# Patient Record
Sex: Male | Born: 1985 | Race: White | Hispanic: No | Smoking: Never smoker
Health system: Southern US, Community
[De-identification: ages and names within clinical notes are randomized; demographics above are authoritative.]

## PROBLEM LIST (undated history)

## (undated) DIAGNOSIS — Q431 Hirschsprung's disease: Secondary | ICD-10-CM

## (undated) HISTORY — PX: OTHER SURGICAL HISTORY: SHX169

---

## 2007-06-19 ENCOUNTER — Emergency Department (HOSPITAL_COMMUNITY): Admission: EM | Admit: 2007-06-19 | Discharge: 2007-06-19 | Payer: Self-pay | Admitting: Emergency Medicine

## 2018-06-10 ENCOUNTER — Ambulatory Visit: Payer: Self-pay | Admitting: *Deleted

## 2018-06-10 NOTE — Telephone Encounter (Signed)
Patient is calling to get an appointment at [facility]- he has not established care at the office.Due to the moderate/severe pain he is having the protocol is to go to ED for evaluation. Patient advised to go to ED at this time- he agrees. Encouraged him to call back to establish care with provider so if he ever needs to be seen for a less acute problem we can accommodate him.  Reason for Disposition . [1] SEVERE pain (e.g., excruciating) AND [2] present > 1 hour  Answer Assessment - Initial Assessment Questions 1. LOCATION: "Where does it hurt?"      Midline lower abdominal pain and moves upward then to the right 2. RADIATION: "Does the pain shoot anywhere else?" (e.g., chest, back)      Moves to the right 3. ONSET: "When did the pain begin?" (Minutes, hours or days ago)      tuesday 4. SUDDEN: "Gradual or sudden onset?"     Sudden- after eating tuna 5. PATTERN "Does the pain come and go, or is it constant?"    - If constant: "Is it getting better, staying the same, or worsening?"      (Note: Constant means the pain never goes away completely; most serious pain is constant and it progresses)     - If intermittent: "How long does it last?" "Do you have pain now?"     (Note: Intermittent means the pain goes away completely between bouts)     Comes and goes- "waves",  Pain is more dull 6. SEVERITY: "How bad is the pain?"  (e.g., Scale 1-10; mild, moderate, or severe)    - MILD (1-3): doesn't interfere with normal activities, abdomen soft and not tender to touch     - MODERATE (4-7): interferes with normal activities or awakens from sleep, tender to touch     - SEVERE (8-10): excruciating pain, doubled over, unable to do any normal activities       7-8 when it is really hurting- 2-3 at dull pain 7. RECURRENT SYMPTOM: "Have you ever had this type of abdominal pain before?" If so, ask: "When was the last time?" and "What happened that time?"      no 8. CAUSE: "What do you think is causing  the abdominal pain?"     At first patient thought it was food related- but now not sure 9. RELIEVING/AGGRAVATING FACTORS: "What makes it better or worse?" (e.g., movement, antacids, bowel movement)     Eating- makes it worse,  Laxatives- still having bowel movements 10. OTHER SYMPTOMS: "Has there been any vomiting, diarrhea, constipation, or urine problems?"       Last night patient had a lot of burping  Protocols used: ABDOMINAL PAIN - MALE-A-AH

## 2018-11-12 ENCOUNTER — Telehealth (INDEPENDENT_AMBULATORY_CARE_PROVIDER_SITE_OTHER): Payer: Self-pay | Admitting: Physical Medicine and Rehabilitation

## 2018-11-12 NOTE — Telephone Encounter (Signed)
Needs to be OV after that long

## 2018-11-12 NOTE — Telephone Encounter (Signed)
Pt was last seen in 2012 and had a Rt S1 TF. Please see message below. Ok to schedule for OV?

## 2018-11-12 NOTE — Telephone Encounter (Addendum)
Pt came in stating he had an injection a few years ago regarding a bulging disk in his lower back. He came in and is experiencing pain in the same place where he had the injection before. Pt was wondering if he can make an apt with Dr. Alvester Morin to see if he could help he does still have his MRI disk from when he had it done the first time regarding this issue. PT # [phone]

## 2018-11-12 NOTE — Telephone Encounter (Signed)
Pt scheduled for 11/24/2018

## 2018-11-24 ENCOUNTER — Ambulatory Visit (INDEPENDENT_AMBULATORY_CARE_PROVIDER_SITE_OTHER): Payer: 59 | Admitting: Physical Medicine and Rehabilitation

## 2018-11-24 ENCOUNTER — Telehealth (INDEPENDENT_AMBULATORY_CARE_PROVIDER_SITE_OTHER): Payer: Self-pay | Admitting: Physical Medicine and Rehabilitation

## 2018-11-24 ENCOUNTER — Encounter (INDEPENDENT_AMBULATORY_CARE_PROVIDER_SITE_OTHER): Payer: Self-pay | Admitting: Physical Medicine and Rehabilitation

## 2018-11-24 VITALS — BP 123/77 | HR 76 | Ht 71.0 in | Wt 205.0 lb

## 2018-11-24 DIAGNOSIS — M25551 Pain in right hip: Secondary | ICD-10-CM | POA: Diagnosis not present

## 2018-11-24 DIAGNOSIS — M5116 Intervertebral disc disorders with radiculopathy, lumbar region: Secondary | ICD-10-CM | POA: Diagnosis not present

## 2018-11-24 DIAGNOSIS — M5441 Lumbago with sciatica, right side: Secondary | ICD-10-CM

## 2018-11-24 DIAGNOSIS — G8929 Other chronic pain: Secondary | ICD-10-CM | POA: Diagnosis not present

## 2018-11-24 NOTE — Patient Instructions (Signed)

## 2018-11-24 NOTE — Progress Notes (Signed)
 .  Numeric Pain Rating Scale and Functional Assessment Average Pain 7 Pain Right Now 3 My pain is constant, dull and aching Pain is worse with: bending and some activites Pain improves with: heat/ice and therapy/exercise   In the last MONTH (on 0-10 scale) has pain interfered with the following?  1. General activity like being  able to carry out your everyday physical activities such as walking, climbing stairs, carrying groceries, or moving a chair?  Rating(4)  2. Relation with others like being able to carry out your usual social activities and roles such as  activities at home, at work and in your community. Rating(4)  3. Enjoyment of life such that you have  been bothered by emotional problems such as feeling anxious, depressed or irritable?  Rating(1)

## 2018-11-25 ENCOUNTER — Encounter (INDEPENDENT_AMBULATORY_CARE_PROVIDER_SITE_OTHER): Payer: Self-pay | Admitting: Physical Medicine and Rehabilitation

## 2018-11-25 NOTE — Telephone Encounter (Signed)
Spoke to [employer] and PA was processed and received an Engineer, structural. Auth # [id number] Effective dates 11/25/2018-02/23/2019.

## 2018-11-25 NOTE — Progress Notes (Signed)
Steven Evans - 33 y.o. male MRN [id number]  Date of birth: 1986-10-11  Office Visit Note: Visit Date: 11/24/2018 PCP: System, Pcp Not In Referred by: No ref. provider found  Subjective: Chief Complaint  Patient presents with  . Lower Back - Pain  . Right Knee - Pain  . Right Leg - Weakness   HPI: Steven Evans is a 33 y.o. male who comes in today For evaluation management of worsening low back and right radicular leg pain posteriorly.  I initially saw the patient in 2012 at the request of Dr. Annell Greening in our practice who was seeing him for a Worker's Compensation injury with disc herniation at L5-S1 with pretty classic S1 distribution pain on the right.  S1 transforaminal injection gave him a lot of relief for probably more than a year.  He has had off-and-on pain really since that time.  He would get intermittent flareups and he would see his chiropractor for adjustment which most of the time would alleviate the symptoms enough for him to be able to work and continue to be functional.  Unfortunately 6 weeks ago the symptoms got progressively worse and 3 weeks ago severely worse to the point where he is having trouble even standing on the leg and using the leg very well.  Symptoms are on the right and a constant dull aching pain referring down the posterior part of the leg into the calf but not to the foot.  No real paresthesia but a classic S1 distribution posteriorly.  He is not had any new trauma.  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  He has not noted any left-sided symptoms ever.  He feels like it is in the same distribution as before.  He feels like the leg gets somewhat weak but has not had any falls or foot drop.  He does use anti-inflammatories and those have not helped.  Again has been seeing a [facility] and this had been helping but not recently.  He has not had any red flag complaints however of bowel or bladder issues or specific  night pain or fever chills or night sweats.  Appropriate Use Addendum Patient was evaluated for low back pain and according to appropriate use criteria (did not) receive imaging tests.   Review of Systems  Constitutional: Negative for chills, fever, malaise/fatigue and weight loss.  HENT: Negative for hearing loss and sinus pain.   Eyes: Negative for blurred vision, double vision and photophobia.  Respiratory: Negative for cough and shortness of breath.   Cardiovascular: Negative for chest pain, palpitations and leg swelling.  Gastrointestinal: Negative for abdominal pain, nausea and vomiting.  Genitourinary: Negative for flank pain.  Musculoskeletal: Positive for back pain. Negative for myalgias.       Right hip and leg pain  Skin: Negative for itching and rash.  Neurological: Negative for tremors, focal weakness and weakness.  Endo/Heme/Allergies: Negative.   Psychiatric/Behavioral: Negative for depression.  All other systems reviewed and are negative.  Otherwise per HPI.  Assessment & Plan: Visit Diagnoses:  1. Chronic bilateral low back pain with right-sided sciatica   2. Radiculopathy due to lumbar intervertebral disc disorder   3. Pain in right hip     Plan: Findings:  Chronic history of low back and right buttock and leg pain secondary to disc herniation and degenerative change at L5-S1.  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  He has managed since that time with intermittent flareups using chiropractic care and some anti-inflammatory.  Over the last couple of months progressive worsening symptoms without alleviation from the chiropractic care.  3 weeks of pretty severe pain rates his average pain is a 7 out of 10.  I think this is still consistent with disc herniation type pain in an S1 distribution.  I recommend diagnostic and hopefully therapeutic S1 transforaminal injection with  fluoroscopic guidance.  This should meet with criteria for his insurance company given the severe amount of pain and prior relief as well as length of time that is been ongoing.  He really has tried and failed conservative care.  If the injection did not help or only helped short-term would consider updating MRI at that point.  I also discussed with him core strengthening and stabilization.  He has been doing that with the chiropractor and he initially had physical therapy in 2012.  He has a pretty physical job so does not really do any extra back exercises.  Discussed with him the future of doing some core strengthening and even potentially looking at weight lifting starting out slow and trying to increase the strength of his spine.  Did print out some exercises and those are in the chart.    Meds & Orders: No orders of the defined types were placed in this encounter.  No orders of the defined types were placed in this encounter.   Follow-up: Return for Right S1 transforaminal epidural steroid injection.   Procedures: No procedures performed  No notes on file   Clinical History: No specialty comments available.   He reports that he has never smoked. He has never used smokeless tobacco. No results for input(s): HGBA1C, LABURIC in the last 8760 hours.  Objective:  VS:  HT:5\' 11"  (180.3 cm)   WT:205 lb (93 kg)  BMI:28.6    BP:123/77  HR:76bpm  TEMP: ( )  RESP:  Physical Exam Vitals signs and nursing note reviewed.  Constitutional:      General: He is not in acute distress.    Appearance: Normal appearance. He is well-developed and normal weight.  HENT:     Head: Normocephalic and atraumatic.     Nose: Nose normal.     Mouth/Throat:     Mouth: Mucous membranes are moist.     Pharynx: Oropharynx is clear.  Eyes:     Conjunctiva/sclera: Conjunctivae normal.     Pupils: Pupils are equal, round, and reactive to light.  Neck:     Musculoskeletal: Normal range of motion and neck  supple.     Trachea: No tracheal deviation.  Cardiovascular:     Rate and Rhythm: Normal rate and regular rhythm.     Pulses: Normal pulses.  Pulmonary:     Effort: Pulmonary effort is normal.     Breath sounds: Normal breath sounds.  Abdominal:     General: There is no distension.     Palpations: Abdomen is soft.     Tenderness: There is no guarding or rebound.  Musculoskeletal:        General: No deformity.     Right lower leg: No edema.     Left lower leg: No edema.     Comments: Patient ambulates without aid and can toe and heel walk without difficulty.  He has no pain with hip rotation.  He has some back pain with extension and  facet loading.  He does have more pain with forward flexion.  He has a positive slump test on the right.  Skin:    General: Skin is warm and dry.     Findings: No erythema or rash.  Neurological:     General: No focal deficit present.     Mental Status: He is alert and oriented to person, place, and time.     Sensory: Sensory deficit present.     Motor: No abnormal muscle tone.     Coordination: Coordination normal.     Gait: Gait normal.     Comments: Some dysesthesia on the right and the posterior part of the leg consistent with S1 pattern  Psychiatric:        Mood and Affect: Mood normal.        Behavior: Behavior normal.        Thought Content: Thought content normal.     Ortho Exam Imaging: No results found.  Past Medical/Family/Surgical/Social History: Medications & Allergies reviewed per EMR, new medications updated. There are no active problems to display for this patient.  History reviewed. No pertinent past medical history. History reviewed. No pertinent family history. History reviewed. No pertinent surgical history. Social History   Occupational History  . Not on file  Tobacco Use  . Smoking status: Never Smoker  . Smokeless tobacco: Never Used  Substance and Sexual Activity  . Alcohol use: Not on file  . Drug use: Not on  file  . Sexual activity: Not on file

## 2018-12-03 ENCOUNTER — Ambulatory Visit (INDEPENDENT_AMBULATORY_CARE_PROVIDER_SITE_OTHER): Payer: Self-pay

## 2018-12-03 ENCOUNTER — Ambulatory Visit (INDEPENDENT_AMBULATORY_CARE_PROVIDER_SITE_OTHER): Payer: 59 | Admitting: Physical Medicine and Rehabilitation

## 2018-12-03 ENCOUNTER — Encounter (INDEPENDENT_AMBULATORY_CARE_PROVIDER_SITE_OTHER): Payer: Self-pay | Admitting: Physical Medicine and Rehabilitation

## 2018-12-03 VITALS — BP 126/76 | HR 66 | Temp 98.5°F

## 2018-12-03 DIAGNOSIS — M5116 Intervertebral disc disorders with radiculopathy, lumbar region: Secondary | ICD-10-CM

## 2018-12-03 DIAGNOSIS — M5416 Radiculopathy, lumbar region: Secondary | ICD-10-CM | POA: Diagnosis not present

## 2018-12-03 MED ORDER — BETAMETHASONE SOD PHOS & ACET 6 (3-3) MG/ML IJ SUSP
12.0000 mg | Freq: Once | INTRAMUSCULAR | Status: AC
  Administered 2018-12-03: 12 mg

## 2018-12-03 NOTE — Procedures (Signed)
S1 Lumbosacral Transforaminal Epidural Steroid Injection - Sub-Pedicular Approach with Fluoroscopic Guidance   [name]      Date of Birth: 1986/09/28 MRN: [id number] PCP: System, Pcp Not In      Visit Date: 12/03/2018   Universal Protocol:    Date/Time: 01/31/208:11 AM  Consent Given By: the patient  Position:  PRONE  Additional Comments: Vital signs were monitored before and after the procedure. Patient was prepped and draped in the usual sterile fashion. The correct patient, procedure, and site was verified.   Injection Procedure Details:  Procedure Site One Meds Administered:  Meds ordered this encounter  Medications  . betamethasone acetate-betamethasone sodium phosphate (CELESTONE) injection 12 mg    Laterality: Right  Location/Site:  S1 Foramen   Needle size: 22 ga.  Needle type: Spinal  Needle Placement: Transforaminal  Findings:   -Comments: Excellent flow of contrast along the nerve and into the epidural space.  Procedure Details: After squaring off the sacral end-plate to get a true AP view, the C-arm was positioned so that the best possible view of the S1 foramen was visualized. The soft tissues overlying this structure were infiltrated with 2-3 ml. of 1% Lidocaine without Epinephrine.    The spinal needle was inserted toward the target using a "trajectory" view along the fluoroscope beam.  Under AP and lateral visualization, the needle was advanced so it did not puncture dura. Biplanar projections were used to confirm position. Aspiration was confirmed to be negative for CSF and/or blood. A 1-2 ml. volume of Isovue-250 was injected and flow of contrast was noted at each level. Radiographs were obtained for documentation purposes.   After attaining the desired flow of contrast documented above, a 0.5 to 1.0 ml test dose of 0.25% Marcaine was injected into each respective transforaminal space.  The patient was observed for 90 seconds post  injection.  After no sensory deficits were reported, and normal lower extremity motor function was noted,   the above injectate was administered so that equal amounts of the injectate were placed at each foramen (level) into the transforaminal epidural space.   Additional Comments:  The patient tolerated the procedure well Dressing: Band-Aid    Post-procedure details: Patient was observed during the procedure. Post-procedure instructions were reviewed.  Patient left the clinic in stable condition.

## 2018-12-03 NOTE — Progress Notes (Signed)
Steven Evans - 33 y.o. male MRN [id number]  Date of birth: 04-11-1986  Office Visit Note: Visit Date: 12/03/2018 PCP: System, Pcp Not In Referred by: No ref. provider found  Subjective: Chief Complaint  Patient presents with  . Lower Back - Pain   HPI:  Steven Evans is a 33 y.o. male who comes in today For planned right S1 transforaminal epidural steroid injection.  Please see our prior evaluation and management note for further details and justification.  ROS Otherwise per HPI.  Assessment & Plan: Visit Diagnoses:  1. Radiculopathy due to lumbar intervertebral disc disorder   2. Lumbar radiculopathy     Plan: No additional findings.   Meds & Orders:  Meds ordered this encounter  Medications  . betamethasone acetate-betamethasone sodium phosphate (CELESTONE) injection 12 mg    Orders Placed This Encounter  Procedures  . XR C-ARM NO REPORT  . Epidural Steroid injection    Follow-up: Return if symptoms worsen or fail to improve.   Procedures: No procedures performed  S1 Lumbosacral Transforaminal Epidural Steroid Injection - Sub-Pedicular Approach with Fluoroscopic Guidance   [name]      Date of Birth: 28-Mar-1986 MRN: [id number] PCP: System, Pcp Not In      Visit Date: 12/03/2018   Universal Protocol:    Date/Time: 01/31/208:11 AM  Consent Given By: the patient  Position:  PRONE  Additional Comments: Vital signs were monitored before and after the procedure. Patient was prepped and draped in the usual sterile fashion. The correct patient, procedure, and site was verified.   Injection Procedure Details:  Procedure Site One Meds Administered:  Meds ordered this encounter  Medications  . betamethasone acetate-betamethasone sodium phosphate (CELESTONE) injection 12 mg    Laterality: Right  Location/Site:  S1 Foramen   Needle size: 22 ga.  Needle type: Spinal  Needle Placement: Transforaminal  Findings:   -Comments: Excellent  flow of contrast along the nerve and into the epidural space.  Procedure Details: After squaring off the sacral end-plate to get a true AP view, the C-arm was positioned so that the best possible view of the S1 foramen was visualized. The soft tissues overlying this structure were infiltrated with 2-3 ml. of 1% Lidocaine without Epinephrine.    The spinal needle was inserted toward the target using a "trajectory" view along the fluoroscope beam.  Under AP and lateral visualization, the needle was advanced so it did not puncture dura. Biplanar projections were used to confirm position. Aspiration was confirmed to be negative for CSF and/or blood. A 1-2 ml. volume of Isovue-250 was injected and flow of contrast was noted at each level. Radiographs were obtained for documentation purposes.   After attaining the desired flow of contrast documented above, a 0.5 to 1.0 ml test dose of 0.25% Marcaine was injected into each respective transforaminal space.  The patient was observed for 90 seconds post injection.  After no sensory deficits were reported, and normal lower extremity motor function was noted,   the above injectate was administered so that equal amounts of the injectate were placed at each foramen (level) into the transforaminal epidural space.   Additional Comments:  The patient tolerated the procedure well Dressing: Band-Aid    Post-procedure details: Patient was observed during the procedure. Post-procedure instructions were reviewed.  Patient left the clinic in stable condition.    Clinical History: No specialty comments available.     Objective:  VS:  HT:    WT:  BMI:     BP:126/76  HR:66bpm  TEMP:98.5 F (36.9 C)(Oral)  RESP:98 % Physical Exam  Ortho Exam Imaging: No results found.

## 2018-12-03 NOTE — Progress Notes (Signed)
Numeric Pain Rating Scale and Functional Assessment Average Pain 4   In the last MONTH (on 0-10 scale) has pain interfered with the following?  1. General activity like being  able to carry out your everyday physical activities such as walking, climbing stairs, carrying groceries, or moving a chair?  Rating(5)    +Driver, -BT, -Dye Allergies.  

## 2019-03-15 ENCOUNTER — Other Ambulatory Visit: Payer: Self-pay | Admitting: Orthopedic Surgery

## 2019-03-17 NOTE — Pre-Procedure Instructions (Addendum)
Adebayo A Jaroszewski  03/17/2019      [id number] [place], [place] - [address] [place] [address] Phone: [phone] Fax: [phone]    Your procedure is scheduled on Mar 23, 2019.  Report to [facility] "[facility]" at 530 AM.  Call this number if you have problems the morning of surgery:  [phone]   Remember: Do not eat  after midnight.  You may drink clear liquids until 430 AM, the morning of surgery.  Clear liquids allowed are:      Ensure Pre-surgery drink, water, juice, carbonated beverages until 430 AM the morning of surgery  DO NOT DRINK ANYTHING AFTER 4:30 AM THE MORNING OF SURGERY    Take these medicines the morning of surgery with A SIP OF WATER  Hydrocodone-acetaminophen (norco)-if needed Tramadol (ultram)-if needed for pain  7 days prior to surgery STOP taking any Aspirin (unless otherwise instructed by your surgeon), Aleve, Naproxen, Ibuprofen, Motrin, Advil, Goody's, BC's, all herbal medications, fish oil, and all vitamins    Do not wear jewelry  Do not wear lotions, powders, or colognes, or deodorant.  Men may shave face and neck.  Do not bring valuables to the hospital.  [facility] is not responsible for any belongings or valuables.  Contacts, dentures or bridgework may not be worn into surgery.  Leave your suitcase in the car.  After surgery it may be brought to your room.  For patients admitted to the hospital, discharge time will be determined by your treatment team.  Patients discharged the day of surgery will not be allowed to drive home.    [facility]- Preparing For Surgery  Before surgery, you can play an important role. Because skin is not sterile, your skin needs to be as free of germs as possible. You can reduce the number of germs on your skin by washing with CHG (chlorahexidine gluconate) Soap before surgery.  CHG is an antiseptic cleaner which kills germs and bonds  with the skin to continue killing germs even after washing.    Oral Hygiene is also important to reduce your risk of infection.  Remember - BRUSH YOUR TEETH THE MORNING OF SURGERY WITH YOUR REGULAR TOOTHPASTE  Please do not use if you have an allergy to CHG or antibacterial soaps. If your skin becomes reddened/irritated stop using the CHG.  Do not shave (including legs and underarms) for at least 48 hours prior to first CHG shower. It is OK to shave your face.  Please follow these instructions carefully.   1. Shower the NIGHT BEFORE SURGERY and the MORNING OF SURGERY with CHG.   2. If you chose to wash your hair, wash your hair first as usual with your normal shampoo.  3. After you shampoo, rinse your hair and body thoroughly to remove the shampoo.  4. Use CHG as you would any other liquid soap. You can apply CHG directly to the skin and wash gently with a scrungie or a clean washcloth.   5. Apply the CHG Soap to your body ONLY FROM THE NECK DOWN.  Do not use on open wounds or open sores. Avoid contact with your eyes, ears, mouth and genitals (private parts). Wash Face and genitals (private parts)  with your normal soap.  6. Wash thoroughly, paying special attention to the area where your surgery will be performed.  7. Thoroughly rinse your body with warm water from the  neck down.  8. DO NOT shower/wash with your normal soap after using and rinsing off the CHG Soap.  9. Pat yourself dry with a CLEAN TOWEL.  10. Wear CLEAN PAJAMAS to bed the night before surgery, wear comfortable clothes the morning of surgery  11. Place CLEAN SHEETS on your bed the night of your first shower and DO NOT SLEEP WITH PETS.   Day of Surgery:  Do not apply any deodorants/lotions.  Please wear clean clothes to the hospital/surgery center.   Remember to brush your teeth WITH YOUR REGULAR TOOTHPASTE.   Please read over the following fact sheets that you were given.

## 2019-03-18 ENCOUNTER — Other Ambulatory Visit: Payer: Self-pay

## 2019-03-18 ENCOUNTER — Encounter (HOSPITAL_COMMUNITY): Payer: Self-pay

## 2019-03-18 ENCOUNTER — Encounter (HOSPITAL_COMMUNITY)
Admission: RE | Admit: 2019-03-18 | Discharge: 2019-03-18 | Disposition: A | Discharge status: Home / Self Care | Payer: No Typology Code available for payment source | Source: Ambulatory Visit | Attending: Orthopedic Surgery | Admitting: Orthopedic Surgery

## 2019-03-18 DIAGNOSIS — Z01812 Encounter for preprocedural laboratory examination: Secondary | ICD-10-CM | POA: Insufficient documentation

## 2019-03-18 DIAGNOSIS — Z1159 Encounter for screening for other viral diseases: Secondary | ICD-10-CM | POA: Insufficient documentation

## 2019-03-18 HISTORY — DX: Hirschsprung's disease: Q43.1

## 2019-03-18 LAB — CBC WITH DIFFERENTIAL/PLATELET
Abs Immature Granulocytes: 0.01 10*3/uL (ref 0.00–0.07)
Basophils Absolute: 0.1 10*3/uL (ref 0.0–0.1)
Basophils Relative: 1 %
Eosinophils Absolute: 0.3 10*3/uL (ref 0.0–0.5)
Eosinophils Relative: 4 %
HCT: 46.1 % (ref 39.0–52.0)
Hemoglobin: 15.8 g/dL (ref 13.0–17.0)
Immature Granulocytes: 0 %
Lymphocytes Relative: 32 %
Lymphs Abs: 2.2 10*3/uL (ref 0.7–4.0)
MCH: 29.6 pg (ref 26.0–34.0)
MCHC: 34.3 g/dL (ref 30.0–36.0)
MCV: 86.3 fL (ref 80.0–100.0)
Monocytes Absolute: 0.6 10*3/uL (ref 0.1–1.0)
Monocytes Relative: 8 %
Neutro Abs: 3.7 10*3/uL (ref 1.7–7.7)
Neutrophils Relative %: 55 %
Platelets: 267 10*3/uL (ref 150–400)
RBC: 5.34 MIL/uL (ref 4.22–5.81)
RDW: 11.8 % (ref 11.5–15.5)
WBC: 6.8 10*3/uL (ref 4.0–10.5)
nRBC: 0 % (ref 0.0–0.2)

## 2019-03-18 LAB — COMPREHENSIVE METABOLIC PANEL
ALT: 59 U/L — ABNORMAL HIGH (ref 0–44)
AST: 32 U/L (ref 15–41)
Albumin: 4 g/dL (ref 3.5–5.0)
Alkaline Phosphatase: 66 U/L (ref 38–126)
Anion gap: 11 (ref 5–15)
BUN: 13 mg/dL (ref 6–20)
CO2: 21 mmol/L — ABNORMAL LOW (ref 22–32)
Calcium: 9.3 mg/dL (ref 8.9–10.3)
Chloride: 108 mmol/L (ref 98–111)
Creatinine, Ser: 1.23 mg/dL (ref 0.61–1.24)
GFR calc Af Amer: >60 mL/min (ref >60)
GFR calc non Af Amer: >60 mL/min (ref >60)
Glucose, Bld: 118 mg/dL — ABNORMAL HIGH (ref 70–99)
Potassium: 3.8 mmol/L (ref 3.5–5.1)
Sodium: 140 mmol/L (ref 135–145)
Total Bilirubin: 0.6 mg/dL (ref 0.3–1.2)
Total Protein: 6.8 g/dL (ref 6.5–8.1)

## 2019-03-18 LAB — URINALYSIS, ROUTINE W REFLEX MICROSCOPIC
Bilirubin Urine: NEGATIVE
Glucose, UA: NEGATIVE mg/dL
Hgb urine dipstick: NEGATIVE
Ketones, ur: NEGATIVE mg/dL
Leukocytes,Ua: NEGATIVE
Nitrite: NEGATIVE
Protein, ur: NEGATIVE mg/dL
Specific Gravity, Urine: 1.019 (ref 1.005–1.030)
pH: 5 (ref 5.0–8.0)

## 2019-03-18 LAB — SURGICAL PCR SCREEN
MRSA, PCR: NEGATIVE
Staphylococcus aureus: POSITIVE — AB

## 2019-03-18 LAB — PROTIME-INR
INR: 1 (ref 0.8–1.2)
Prothrombin Time: 13 seconds (ref 11.4–15.2)

## 2019-03-18 LAB — APTT: aPTT: 26 seconds (ref 24–36)

## 2019-03-18 NOTE — Progress Notes (Signed)
[facility] [place] [facility] Mupirocin. Pt unable to be reached by phone, voicemail left.

## 2019-03-18 NOTE — Progress Notes (Addendum)
PCP - denies Cardiologist -denies   Chest x-ray - N/A EKG - N/A Stress Test -denies  ECHO - denies Cardiac Cath - denies  Sleep Study - denies CPAP - denies  Blood Thinner Instructions: N/A Aspirin Instructions:N/A  Anesthesia review: No  Labs at PAT: CBC w/ diff, CMP, PT/INR, UA, PCR.  Pt educated on NO visitor policy and stated father will be transportation home on DOS. Will have 24 hour supervision after surgery.   Patient denies shortness of breath, fever, cough and chest pain at PAT appointment   Patient verbalized understanding of instructions that were given to them at the PAT appointment. Patient was also instructed that they will need to review over the PAT instructions again at home before surgery.  Pt confirmed he has an appointment for his CoVID-19 test at the drive-thru testing site on Monday, 03/21/19.  Coronavirus Screening  Have you experienced the following symptoms:  Cough yes/no: No Fever (>100.72F)  yes/no: No Runny nose yes/no: No Sore throat yes/no: No Difficulty breathing/shortness of breath  yes/no: No  Have you or a family member traveled in the last 14 days and where? yes/no: No   If the patient indicates "YES" to the above questions, their PAT will be rescheduled to limit the exposure to others and, the surgeon will be notified. THE PATIENT WILL NEED TO BE ASYMPTOMATIC FOR 14 DAYS.   If the patient is not experiencing any of these symptoms, the PAT nurse will instruct them to NOT bring anyone with them to their appointment since they may have these symptoms or traveled as well.   Please remind your patients and families that hospital visitation restrictions are in effect and the importance of the restrictions.

## 2019-03-21 ENCOUNTER — Other Ambulatory Visit (HOSPITAL_COMMUNITY)
Admission: RE | Admit: 2019-03-21 | Discharge: 2019-03-21 | Disposition: A | Discharge status: Home / Self Care | Payer: Managed Care, Other (non HMO) | Source: Ambulatory Visit | Attending: Orthopedic Surgery | Admitting: Orthopedic Surgery

## 2019-03-21 DIAGNOSIS — Z1159 Encounter for screening for other viral diseases: Secondary | ICD-10-CM | POA: Insufficient documentation

## 2019-03-22 LAB — NOVEL CORONAVIRUS, NAA (HOSP ORDER, SEND-OUT TO REF LAB; TAT 18-24 HRS): SARS-CoV-2, NAA: NOT DETECTED

## 2019-03-22 NOTE — Anesthesia Preprocedure Evaluation (Addendum)
Anesthesia Evaluation  Patient identified by MRN, date of birth, ID band Patient awake    Reviewed: Allergy & Precautions, NPO status , Patient's Chart, lab work & pertinent test results  Airway Mallampati: II  TM Distance: >3 FB Neck ROM: Full    Dental no notable dental hx. (+) Teeth Intact, Dental Advisory Given   Pulmonary neg pulmonary ROS,    Pulmonary exam normal breath sounds clear to auscultation       Cardiovascular negative cardio ROS Normal cardiovascular exam Rhythm:Regular Rate:Normal     Neuro/Psych negative neurological ROS  negative psych ROS   GI/Hepatic Neg liver ROS, Hirschsprung disease   Endo/Other  negative endocrine ROS  Renal/GU negative Renal ROS  negative genitourinary   Musculoskeletal Herniated disc L5-S1 with S1 radiculopathy   Abdominal   Peds  Hematology negative hematology ROS (+)   Anesthesia Other Findings   Reproductive/Obstetrics                           Anesthesia Physical Anesthesia Plan  ASA: I  Anesthesia Plan: General   Post-op Pain Management:    Induction: Intravenous  PONV Risk Score and Plan: 3 and Scopolamine patch - Pre-op, Midazolam, Ondansetron, Dexamethasone and Treatment may vary due to age or medical condition  Airway Management Planned: Oral ETT  Additional Equipment:   Intra-op Plan:   Post-operative Plan: Extubation in OR  Informed Consent: I have reviewed the patients History and Physical, chart, labs and discussed the procedure including the risks, benefits and alternatives for the proposed anesthesia with the patient or authorized representative who has indicated his/her understanding and acceptance.     Dental advisory given  Plan Discussed with: CRNA and Surgeon  Anesthesia Plan Comments:        Anesthesia Quick Evaluation

## 2019-03-23 ENCOUNTER — Other Ambulatory Visit: Payer: Self-pay

## 2019-03-23 ENCOUNTER — Ambulatory Visit (HOSPITAL_COMMUNITY)
Admission: RE | Admit: 2019-03-23 | Discharge: 2019-03-23 | Disposition: A | Discharge status: Home / Self Care | Payer: No Typology Code available for payment source | Attending: Orthopedic Surgery | Admitting: Orthopedic Surgery

## 2019-03-23 ENCOUNTER — Ambulatory Visit (HOSPITAL_COMMUNITY): Payer: No Typology Code available for payment source | Admitting: Anesthesiology

## 2019-03-23 ENCOUNTER — Ambulatory Visit (HOSPITAL_COMMUNITY): Payer: No Typology Code available for payment source

## 2019-03-23 ENCOUNTER — Ambulatory Visit (HOSPITAL_COMMUNITY): Payer: No Typology Code available for payment source | Admitting: Vascular Surgery

## 2019-03-23 ENCOUNTER — Encounter (HOSPITAL_COMMUNITY): Payer: Self-pay

## 2019-03-23 ENCOUNTER — Encounter (HOSPITAL_COMMUNITY)
Admission: RE | Disposition: A | Discharge status: Home / Self Care | Payer: Self-pay | Source: Home / Self Care | Attending: Orthopedic Surgery

## 2019-03-23 DIAGNOSIS — Z419 Encounter for procedure for purposes other than remedying health state, unspecified: Secondary | ICD-10-CM

## 2019-03-23 DIAGNOSIS — Z79899 Other long term (current) drug therapy: Secondary | ICD-10-CM | POA: Diagnosis not present

## 2019-03-23 DIAGNOSIS — M5117 Intervertebral disc disorders with radiculopathy, lumbosacral region: Secondary | ICD-10-CM | POA: Insufficient documentation

## 2019-03-23 HISTORY — PX: LUMBAR LAMINECTOMY/DECOMPRESSION MICRODISCECTOMY: SHX5026

## 2019-03-23 SURGERY — LUMBAR LAMINECTOMY/DECOMPRESSION MICRODISCECTOMY
Anesthesia: General | Site: Spine Lumbar | Laterality: Right

## 2019-03-23 MED ORDER — ACETAMINOPHEN 10 MG/ML IV SOLN
INTRAVENOUS | Status: AC
  Filled 2019-03-23: qty 100

## 2019-03-23 MED ORDER — ACETAMINOPHEN 10 MG/ML IV SOLN
1000.0000 mg | Freq: Once | INTRAVENOUS | Status: AC
  Administered 2019-03-23: 11:00:00 1000 mg via INTRAVENOUS
  Filled 2019-03-23: qty 100

## 2019-03-23 MED ORDER — BUPIVACAINE-EPINEPHRINE 0.25% -1:200000 IJ SOLN
INTRAMUSCULAR | Status: DC | PRN
  Administered 2019-03-23: 30 mL

## 2019-03-23 MED ORDER — ROCURONIUM BROMIDE 10 MG/ML (PF) SYRINGE
PREFILLED_SYRINGE | INTRAVENOUS | Status: DC | PRN
  Administered 2019-03-23: 40 mg via INTRAVENOUS
  Administered 2019-03-23: 20 mg via INTRAVENOUS

## 2019-03-23 MED ORDER — METHYLENE BLUE 0.5 % INJ SOLN
INTRAVENOUS | Status: DC | PRN
  Administered 2019-03-23: 5 mL via SUBMUCOSAL

## 2019-03-23 MED ORDER — SCOPOLAMINE 1 MG/3DAYS TD PT72
1.0000 | MEDICATED_PATCH | TRANSDERMAL | Status: DC
  Administered 2019-03-23: 1.5 mg via TRANSDERMAL
  Filled 2019-03-23: qty 1

## 2019-03-23 MED ORDER — ONDANSETRON HCL 4 MG/2ML IJ SOLN
INTRAMUSCULAR | Status: DC | PRN
  Administered 2019-03-23: 4 mg via INTRAVENOUS

## 2019-03-23 MED ORDER — OXYCODONE HCL 5 MG PO TABS
5.0000 mg | ORAL_TABLET | Freq: Once | ORAL | Status: AC
  Administered 2019-03-23: 5 mg via ORAL

## 2019-03-23 MED ORDER — FENTANYL CITRATE (PF) 250 MCG/5ML IJ SOLN
INTRAMUSCULAR | Status: AC
  Filled 2019-03-23: qty 5

## 2019-03-23 MED ORDER — METHYLPREDNISOLONE ACETATE 40 MG/ML IJ SUSP
INTRAMUSCULAR | Status: AC
  Filled 2019-03-23: qty 1

## 2019-03-23 MED ORDER — BUPIVACAINE-EPINEPHRINE (PF) 0.25% -1:200000 IJ SOLN
INTRAMUSCULAR | Status: AC
  Filled 2019-03-23: qty 30

## 2019-03-23 MED ORDER — METHYLPREDNISOLONE ACETATE 40 MG/ML IJ SUSP
INTRAMUSCULAR | Status: DC | PRN
  Administered 2019-03-23: 1 mL

## 2019-03-23 MED ORDER — LIDOCAINE 2% (20 MG/ML) 5 ML SYRINGE
INTRAMUSCULAR | Status: DC | PRN
  Administered 2019-03-23: 80 mg via INTRAVENOUS

## 2019-03-23 MED ORDER — CEFAZOLIN SODIUM-DEXTROSE 2-4 GM/100ML-% IV SOLN
2.0000 g | INTRAVENOUS | Status: AC
  Administered 2019-03-23: 09:00:00 2 g via INTRAVENOUS
  Filled 2019-03-23: qty 100

## 2019-03-23 MED ORDER — SODIUM CHLORIDE (PF) 0.9 % IJ SOLN: INTRAMUSCULAR | Status: DC | PRN

## 2019-03-23 MED ORDER — METHYLENE BLUE 0.5 % INJ SOLN
INTRAVENOUS | Status: AC
  Filled 2019-03-23: qty 10

## 2019-03-23 MED ORDER — MIDAZOLAM HCL 2 MG/2ML IJ SOLN
INTRAMUSCULAR | Status: AC
  Filled 2019-03-23: qty 2

## 2019-03-23 MED ORDER — 0.9 % SODIUM CHLORIDE (POUR BTL) OPTIME
TOPICAL | Status: DC | PRN
  Administered 2019-03-23: 1000 mL

## 2019-03-23 MED ORDER — OXYCODONE-ACETAMINOPHEN 5-325 MG PO TABS: 1.0000 | ORAL_TABLET | ORAL | 0 refills | Status: AC | PRN

## 2019-03-23 MED ORDER — SUCCINYLCHOLINE CHLORIDE 200 MG/10ML IV SOSY
PREFILLED_SYRINGE | INTRAVENOUS | Status: DC | PRN
  Administered 2019-03-23: 120 mg via INTRAVENOUS

## 2019-03-23 MED ORDER — PROPOFOL 10 MG/ML IV BOLUS
INTRAVENOUS | Status: AC
  Filled 2019-03-23: qty 20

## 2019-03-23 MED ORDER — BUPIVACAINE LIPOSOME 1.3 % IJ SUSP
INTRAMUSCULAR | Status: DC | PRN
  Administered 2019-03-23: 20 mL

## 2019-03-23 MED ORDER — OXYCODONE HCL 5 MG PO TABS
ORAL_TABLET | ORAL | Status: AC
  Filled 2019-03-23: qty 1

## 2019-03-23 MED ORDER — DEXAMETHASONE SODIUM PHOSPHATE 10 MG/ML IJ SOLN
INTRAMUSCULAR | Status: DC | PRN
  Administered 2019-03-23: 10 mg via INTRAVENOUS

## 2019-03-23 MED ORDER — SUGAMMADEX SODIUM 200 MG/2ML IV SOLN
INTRAVENOUS | Status: DC | PRN
  Administered 2019-03-23: 200 mg via INTRAVENOUS

## 2019-03-23 MED ORDER — HEMOSTATIC AGENTS (NO CHARGE) OPTIME
TOPICAL | Status: DC | PRN
  Administered 2019-03-23: 1

## 2019-03-23 MED ORDER — LACTATED RINGERS IV SOLN
INTRAVENOUS | Status: DC | PRN
  Administered 2019-03-23 (×2): via INTRAVENOUS

## 2019-03-23 MED ORDER — THROMBIN 20000 UNITS EX SOLR
CUTANEOUS | Status: DC | PRN
  Administered 2019-03-23: 09:00:00 20 mL via TOPICAL

## 2019-03-23 MED ORDER — FENTANYL CITRATE (PF) 100 MCG/2ML IJ SOLN
INTRAMUSCULAR | Status: DC | PRN
  Administered 2019-03-23 (×2): 50 ug via INTRAVENOUS
  Administered 2019-03-23: 100 ug via INTRAVENOUS
  Administered 2019-03-23: 50 ug via INTRAVENOUS
  Administered 2019-03-23: 100 ug via INTRAVENOUS
  Administered 2019-03-23: 50 ug via INTRAVENOUS

## 2019-03-23 MED ORDER — SUCCINYLCHOLINE CHLORIDE 200 MG/10ML IV SOSY
PREFILLED_SYRINGE | INTRAVENOUS | Status: AC
  Filled 2019-03-23: qty 10

## 2019-03-23 MED ORDER — ROCURONIUM BROMIDE 10 MG/ML (PF) SYRINGE
PREFILLED_SYRINGE | INTRAVENOUS | Status: AC
  Filled 2019-03-23: qty 10

## 2019-03-23 MED ORDER — ONDANSETRON HCL 4 MG/2ML IJ SOLN
INTRAMUSCULAR | Status: AC
  Filled 2019-03-23: qty 2

## 2019-03-23 MED ORDER — BUPIVACAINE LIPOSOME 1.3 % IJ SUSP
20.0000 mL | Freq: Once | INTRAMUSCULAR | Status: DC
  Filled 2019-03-23: qty 20

## 2019-03-23 MED ORDER — DEXAMETHASONE SODIUM PHOSPHATE 10 MG/ML IJ SOLN
INTRAMUSCULAR | Status: AC
  Filled 2019-03-23: qty 1

## 2019-03-23 MED ORDER — ONDANSETRON HCL 4 MG/2ML IJ SOLN: 4.0000 mg | Freq: Once | INTRAMUSCULAR | Status: DC | PRN

## 2019-03-23 MED ORDER — MIDAZOLAM HCL 5 MG/5ML IJ SOLN
INTRAMUSCULAR | Status: DC | PRN
  Administered 2019-03-23: 2 mg via INTRAVENOUS

## 2019-03-23 MED ORDER — THROMBIN 20000 UNITS EX SOLR
CUTANEOUS | Status: AC
  Filled 2019-03-23: qty 20000

## 2019-03-23 MED ORDER — LIDOCAINE 2% (20 MG/ML) 5 ML SYRINGE
INTRAMUSCULAR | Status: AC
  Filled 2019-03-23: qty 5

## 2019-03-23 MED ORDER — SCOPOLAMINE 1 MG/3DAYS TD PT72
MEDICATED_PATCH | TRANSDERMAL | Status: AC
  Filled 2019-03-23: qty 1

## 2019-03-23 MED ORDER — PROPOFOL 10 MG/ML IV BOLUS
INTRAVENOUS | Status: DC | PRN
  Administered 2019-03-23: 200 mg via INTRAVENOUS

## 2019-03-23 MED ORDER — DIAZEPAM 5 MG PO TABS: 5.0000 mg | ORAL_TABLET | Freq: Three times a day (TID) | ORAL | 0 refills | Status: AC | PRN

## 2019-03-23 MED ORDER — FENTANYL CITRATE (PF) 100 MCG/2ML IJ SOLN
INTRAMUSCULAR | Status: AC
  Filled 2019-03-23: qty 2

## 2019-03-23 MED ORDER — FENTANYL CITRATE (PF) 100 MCG/2ML IJ SOLN
25.0000 ug | INTRAMUSCULAR | Status: DC | PRN
  Administered 2019-03-23: 12:00:00 50 ug via INTRAVENOUS

## 2019-03-23 MED ORDER — KETAMINE HCL 10 MG/ML IJ SOLN
INTRAMUSCULAR | Status: DC | PRN
  Administered 2019-03-23: 10 mg via INTRAVENOUS
  Administered 2019-03-23: 40 mg via INTRAVENOUS

## 2019-03-23 MED ORDER — KETAMINE HCL 50 MG/5ML IJ SOSY
PREFILLED_SYRINGE | INTRAMUSCULAR | Status: AC
  Filled 2019-03-23: qty 5

## 2019-03-23 MED ORDER — POVIDONE-IODINE 7.5 % EX SOLN
Freq: Once | CUTANEOUS | Status: DC
  Filled 2019-03-23: qty 118

## 2019-03-23 MED ORDER — HYDROCODONE-ACETAMINOPHEN 7.5-325 MG PO TABS: 1.0000 | ORAL_TABLET | Freq: Once | ORAL | Status: DC | PRN

## 2019-03-23 SURGICAL SUPPLY — 71 items
BENZOIN TINCTURE PRP APPL 2/3 (GAUZE/BANDAGES/DRESSINGS) ×2 IMPLANT
BUR PRECISION FLUTE 5.0 (BURR) ×2 IMPLANT
CABLE BIPOLOR RESECTION CORD (MISCELLANEOUS) ×2 IMPLANT
CANISTER SUCT 3000ML PPV (MISCELLANEOUS) ×2 IMPLANT
CARTRIDGE OIL MAESTRO DRILL (MISCELLANEOUS) ×1 IMPLANT
COVER SURGICAL LIGHT HANDLE (MISCELLANEOUS) ×2 IMPLANT
COVER WAND RF STERILE (DRAPES) ×2 IMPLANT
DIFFUSER DRILL AIR PNEUMATIC (MISCELLANEOUS) ×2 IMPLANT
DRAIN CHANNEL 15F RND FF W/TCR (WOUND CARE) IMPLANT
DRAPE POUCH INSTRU U-SHP 10X18 (DRAPES) ×4 IMPLANT
DRAPE SURG 17X23 STRL (DRAPES) ×8 IMPLANT
DURAPREP 26ML APPLICATOR (WOUND CARE) ×2 IMPLANT
ELECT BLADE 4.0 EZ CLEAN MEGAD (MISCELLANEOUS) ×2
ELECT CAUTERY BLADE 6.4 (BLADE) ×2 IMPLANT
ELECT REM PT RETURN 9FT ADLT (ELECTROSURGICAL) ×2
ELECTRODE BLDE 4.0 EZ CLN MEGD (MISCELLANEOUS) ×1 IMPLANT
ELECTRODE REM PT RTRN 9FT ADLT (ELECTROSURGICAL) ×1 IMPLANT
EVACUATOR SILICONE 100CC (DRAIN) IMPLANT
FILTER STRAW FLUID ASPIR (MISCELLANEOUS) ×2 IMPLANT
GAUZE 4X4 16PLY RFD (DISPOSABLE) ×2 IMPLANT
GAUZE SPONGE 4X4 12PLY STRL (GAUZE/BANDAGES/DRESSINGS) ×2 IMPLANT
GAUZE SPONGE 4X4 12PLY STRL LF (GAUZE/BANDAGES/DRESSINGS) ×2 IMPLANT
GLOVE BIO SURGEON STRL SZ7 (GLOVE) ×2 IMPLANT
GLOVE BIO SURGEON STRL SZ8 (GLOVE) ×2 IMPLANT
GLOVE BIOGEL PI IND STRL 7.0 (GLOVE) ×1 IMPLANT
GLOVE BIOGEL PI IND STRL 8 (GLOVE) ×1 IMPLANT
GLOVE BIOGEL PI INDICATOR 7.0 (GLOVE) ×1
GLOVE BIOGEL PI INDICATOR 8 (GLOVE) ×1
GOWN STRL REUS W/ TWL LRG LVL3 (GOWN DISPOSABLE) ×1 IMPLANT
GOWN STRL REUS W/ TWL XL LVL3 (GOWN DISPOSABLE) ×2 IMPLANT
GOWN STRL REUS W/TWL LRG LVL3 (GOWN DISPOSABLE) ×1
GOWN STRL REUS W/TWL XL LVL3 (GOWN DISPOSABLE) ×2
IV CATH 14GX2 1/4 (CATHETERS) ×2 IMPLANT
KIT BASIN OR (CUSTOM PROCEDURE TRAY) ×2 IMPLANT
KIT POSITION SURG JACKSON T1 (MISCELLANEOUS) ×2 IMPLANT
KIT TURNOVER KIT B (KITS) ×2 IMPLANT
MATRIX HEMOSTAT SURGIFLO (HEMOSTASIS) ×2 IMPLANT
NEEDLE 18GX1X1/2 (RX/OR ONLY) (NEEDLE) ×2 IMPLANT
NEEDLE 22X1 1/2 (OR ONLY) (NEEDLE) ×2 IMPLANT
NEEDLE HYPO 25GX1X1/2 BEV (NEEDLE) ×2 IMPLANT
NEEDLE SPNL 18GX3.5 QUINCKE PK (NEEDLE) ×4 IMPLANT
NS IRRIG 1000ML POUR BTL (IV SOLUTION) ×2 IMPLANT
OIL CARTRIDGE MAESTRO DRILL (MISCELLANEOUS) ×2
PACK LAMINECTOMY ORTHO (CUSTOM PROCEDURE TRAY) ×2 IMPLANT
PACK UNIVERSAL I (CUSTOM PROCEDURE TRAY) ×2 IMPLANT
PAD ARMBOARD 7.5X6 YLW CONV (MISCELLANEOUS) ×4 IMPLANT
PATTIES SURGICAL .5 X.5 (GAUZE/BANDAGES/DRESSINGS) IMPLANT
PATTIES SURGICAL .5 X1 (DISPOSABLE) ×2 IMPLANT
SPONGE INTESTINAL PEANUT (DISPOSABLE) ×4 IMPLANT
SPONGE SURGIFOAM ABS GEL 100 (HEMOSTASIS) ×2 IMPLANT
SPONGE SURGIFOAM ABS GEL SZ50 (HEMOSTASIS) ×2 IMPLANT
STRIP CLOSURE SKIN 1/2X4 (GAUZE/BANDAGES/DRESSINGS) ×2 IMPLANT
SURGIFLO W/THROMBIN 8M KIT (HEMOSTASIS) IMPLANT
SUT MNCRL AB 4-0 PS2 18 (SUTURE) ×2 IMPLANT
SUT VIC AB 0 CT1 18XCR BRD 8 (SUTURE) IMPLANT
SUT VIC AB 0 CT1 27 (SUTURE)
SUT VIC AB 0 CT1 27XBRD ANBCTR (SUTURE) IMPLANT
SUT VIC AB 0 CT1 8-18 (SUTURE)
SUT VIC AB 1 CT1 18XCR BRD 8 (SUTURE) ×1 IMPLANT
SUT VIC AB 1 CT1 8-18 (SUTURE) ×1
SUT VIC AB 2-0 CT2 18 VCP726D (SUTURE) ×2 IMPLANT
SYR 20CC LL (SYRINGE) ×2 IMPLANT
SYR BULB IRRIGATION 50ML (SYRINGE) ×2 IMPLANT
SYR CONTROL 10ML LL (SYRINGE) ×4 IMPLANT
SYR TB 1ML 26GX3/8 SAFETY (SYRINGE) ×4 IMPLANT
SYR TB 1ML LUER SLIP (SYRINGE) ×4 IMPLANT
TAPE CLOTH SURG 6X10 WHT LF (GAUZE/BANDAGES/DRESSINGS) ×2 IMPLANT
TOWEL GREEN STERILE (TOWEL DISPOSABLE) ×2 IMPLANT
TOWEL GREEN STERILE FF (TOWEL DISPOSABLE) ×2 IMPLANT
WATER STERILE IRR 1000ML POUR (IV SOLUTION) ×2 IMPLANT
YANKAUER SUCT BULB TIP NO VENT (SUCTIONS) ×2 IMPLANT

## 2019-03-23 NOTE — Anesthesia Procedure Notes (Signed)
Procedure Name: Intubation Date/Time: 03/23/2019 8:35 AM Performed by: Lovie Chol, CRNA Pre-anesthesia Checklist: Patient identified, Emergency Drugs available, Suction available and Patient being monitored Patient Re-evaluated:Patient Re-evaluated prior to induction Oxygen Delivery Method: Circle System Utilized Preoxygenation: Pre-oxygenation with 100% oxygen Induction Type: IV induction Ventilation: Mask ventilation without difficulty Laryngoscope Size: Miller and 3 Grade View: Grade I Tube type: Oral Tube size: 7.5 mm Number of attempts: 1 Airway Equipment and Method: Stylet Placement Confirmation: ETT inserted through vocal cords under direct vision,  positive ETCO2 and breath sounds checked- equal and bilateral Secured at: 21 cm Tube secured with: Tape Dental Injury: Teeth and Oropharynx as per pre-operative assessment

## 2019-03-23 NOTE — H&P (Signed)
PREOPERATIVE H&P  Chief Complaint: Severe right leg pain  HPI: Steven Evans is a 33 y.o. male who presents with ongoing pain in the right leg  MRI reveals a very large right L5/S1 HNP, sith severe compression of the right S1 nerve  Patient has failed multiple forms of conservative care and continues to have pain (see office notes for additional details regarding the patient's full course of treatment)  Past Medical History:  Diagnosis Date  . Hirschsprung's disease    Past Surgical History:  Procedure Laterality Date  . OTHER SURGICAL HISTORY     hirschsprung's disease repair as a baby   Social History   Socioeconomic History  . Marital status: Single    Spouse name: Not on file  . Number of children: Not on file  . Years of education: Not on file  . Highest education level: Not on file  Occupational History  . Not on file  Social Needs  . Financial resource strain: Not on file  . Food insecurity:    Worry: Not on file    Inability: Not on file  . Transportation needs:    Medical: Not on file    Non-medical: Not on file  Tobacco Use  . Smoking status: Never Smoker  . Smokeless tobacco: Current User    Types: Chew  Substance and Sexual Activity  . Alcohol use: Not Currently    Comment: socially   . Drug use: Never  . Sexual activity: Not on file  Lifestyle  . Physical activity:    Days per week: Not on file    Minutes per session: Not on file  . Stress: Not on file  Relationships  . Social connections:    Talks on phone: Not on file    Gets together: Not on file    Attends religious service: Not on file    Active member of club or organization: Not on file    Attends meetings of clubs or organizations: Not on file    Relationship status: Not on file  Other Topics Concern  . Not on file  Social History Narrative  . Not on file   History reviewed. No pertinent family history. No Known Allergies Prior to Admission medications   Medication Sig  Start Date End Date Taking? Authorizing Provider  HYDROcodone-acetaminophen (NORCO/VICODIN) 5-325 MG tablet Take 1-2 tablets by mouth at bedtime as needed for pain. 03/07/19  Yes [provider]  Multiple Vitamins-Minerals (MULTIVITAMIN WITH MINERALS) tablet Take 1 tablet by mouth daily.   Yes [provider]  traMADol (ULTRAM) 50 MG tablet Take 50-100 tablets by mouth every 6 (six) hours. 03/07/19  Yes [provider]  cyclobenzaprine (FLEXERIL) 5 MG tablet Take 5-10 mg by mouth at bedtime as needed. 03/07/19   [provider]     All other systems have been reviewed and were otherwise negative with the exception of those mentioned in the HPI and as above.  Physical Exam: Vitals:   03/23/19 0632  BP: 134/86  Pulse: 60  Resp: 18  Temp: 98.2 F (36.8 C)  SpO2: 99%    Body mass index is 29.33 kg/m.  General: Alert, no acute distress Cardiovascular: No pedal edema Respiratory: No cyanosis, no use of accessory musculature Skin: No lesions in the area of chief complaint Neurologic: Sensation intact distally Psychiatric: Patient is competent for consent with normal mood and affect Lymphatic: No axillary or cervical lymphadenopathy  MUSCULOSKELETAL: + SLR on the right  Assessment/Plan: RIGHT S1 RADICULOPATHY Plan for Procedure(s): RIGHT SIDED-LUMBAR 5 - SACRUM 1 MICRODISECTOMY   Jackelyn HoehnMark L Ibrohim Simmers, MD 03/23/2019 7:44 AM

## 2019-03-23 NOTE — Transfer of Care (Signed)
Immediate Anesthesia Transfer of Care Note  [name]  Procedure(s) Performed: RIGHT SIDED-LUMBAR 5 - SACRUM 1 MICRODISECTOMY (Right Spine Lumbar)  Patient Location: PACU  Anesthesia Type:General  Level of Consciousness: oriented, drowsy and patient cooperative  Airway & Oxygen Therapy: Patient Spontanous Breathing and Patient connected to nasal cannula oxygen  Post-op Assessment: Report given to RN and Post -op Vital signs reviewed and stable  Post vital signs: Reviewed  Last Vitals:  Vitals Value Taken Time  BP 139/96 03/23/2019 11:46 AM  Temp    Pulse 109 03/23/2019 11:47 AM  Resp 18 03/23/2019 11:47 AM  SpO2 99 % 03/23/2019 11:47 AM  Vitals shown include unvalidated device data.  Last Pain:  Vitals:   03/23/19 0637  TempSrc:   PainSc: 8       Patients Stated Pain Goal: 3 (03/23/19 8118)  Complications: No apparent anesthesia complications

## 2019-03-23 NOTE — Op Note (Signed)
PATIENT NAME: Steven Evans   MEDICAL RECORD NO.:   [id number]    PHYSICIAN:  Estill BambergMark Khanh Tanori, MD      DATE OF BIRTH: 04-06-1986  ASSISTANT: Jason CoopKAYLA MCKENZIE, PA-C   DATE OF PROCEDURE: 03/23/2019                              OPERATIVE REPORT     PREOPERATIVE DIAGNOSES: 1. Right-sided S1 radiculopathy. 2. Large right-sided L5-S1 disk herniation causing severe     compression of the right S1 nerve.   POSTOPERATIVE DIAGNOSES: 1. Right-sided S1 radiculopathy. 2. Large right-sided L5-S1 disk herniation causing severe     compression of the right S1 nerve.    PROCEDURES:  Right-sided L5-S1 laminotomy with partial facetectomy and removal of large herniated right-sided L5-S1 disk fragment.   SURGEON:  Estill BambergMark Naseem Varden, MD.   ASSISTANJason Coop:  Kayla McKenzie, PA-C.   ANESTHESIA:  General endotracheal anesthesia.   COMPLICATIONS:  None.   DISPOSITION:  Stable.   ESTIMATED BLOOD LOSS:  Minimal.   INDICATIONS FOR SURGERY:  Briefly, Steven Evans is a pleasant 33 year old male, who did present to me with severe pain in the right Leg s/p a work injury.  The patient's MRI did reveal the findings outlined above, clearly notable for a large herniated disk fragment. The pain was rather severe and he did have weakness in her leg as well.  We did discuss treatment options and we did ultimately elect to proceed with the procedure reflected above.  The patient was fully made aware of the risks of surgery, including the risk of recurrent herniation and the need for subsequent surgery, including the possibility of a subsequent diskectomy and/or fusion.   OPERATIVE DETAILS:  On 03/23/2019, the patient was brought to surgery and general endotracheal anesthesia was administered.  The patient was placed prone on a well-padded flat Jackson bed with a spinal frame.  Antibiotics were given.  The back was prepped and draped and a time-out procedure was performed.  At this point, a midline incision was made directly  over the L5-S1 intervertebral space.  A curvilinear incision was made just to the right of the midline into the fascia.  A self-retaining McCulloch retractor was placed.  The lamina of L5 and S1 was identified and subperiosteally exposed.  I then removed the lateral aspect of the L5-S1 ligamentum flavum.  Readily identified was the traversing right S1 nerve, which was noted to be under obvious tension, and was noted to be rather erythematous.  I was able to gently gain medial retraction of the nerve, and in doing so, a very large herniated disk fragment was readily noted.  This was removed in one very large fragment.  This substantially lessened the tension on the nerve.  I then additionally explored the lateral recess, and with additional medial retraction of the nerve, an additional additional disc material was readily encountered. This was medial and very adherent the anulus and vertebral bodies. I did liberally use an epstein curette and displaced multiple fragments into the intervertebral space, after which point they were removed. There was a moderate anulotomy. I was very pleased with the final decompression that I was able to accomplish.  At this point, the wound was copiously irrigated with normal saline.  All epidural bleeding was controlled using bipolar electrocautery in addition to Surgiflo. All bleeding was controlled at the termination of the procedure.  At this point, 20 mg of Depo-Medrol was introduced about  the epidural space in the region of the right S1 nerve.  The wound was then closed in layers using #1 Vicryl followed by 0 Vicryl, followed by 4-0 Monocryl. Benzoin and Steri-Strips were applied followed by a sterile dressing. All instrument counts were correct at the termination of the procedure.   Of note, Jason Coop was my assistant throughout surgery, and did aid in retraction, suctioning, and closure from start to finish.         Estill Bamberg, MD

## 2019-03-23 NOTE — Anesthesia Postprocedure Evaluation (Signed)
Anesthesia Post Note  [name]  Procedure(s) Performed: RIGHT SIDED-LUMBAR 5 - SACRUM 1 MICRODISECTOMY (Right Spine Lumbar)     Patient location during evaluation: PACU Anesthesia Type: General Level of consciousness: awake and alert and oriented Pain management: pain level controlled Vital Signs Assessment: post-procedure vital signs reviewed and stable Respiratory status: spontaneous breathing, nonlabored ventilation and respiratory function stable Cardiovascular status: blood pressure returned to baseline and stable Postop Assessment: no apparent nausea or vomiting Anesthetic complications: no    Last Vitals:  Vitals:   03/23/19 1200 03/23/19 1215  BP: 139/81 (!) 130/93  Pulse: (!) 104 99  Resp: 17 17  Temp:    SpO2: 99% 94%    Last Pain:  Vitals:   03/23/19 1208  TempSrc:   PainSc: 5                  Katina Remick A.

## 2019-03-24 ENCOUNTER — Encounter (HOSPITAL_COMMUNITY): Payer: Self-pay | Admitting: Orthopedic Surgery

## 2020-12-11 IMAGING — CR LUMBAR SPINE - 2-3 VIEW
2 series · 2 of 2 positions shown · non-contrast
Comparison: None.

CLINICAL DATA: Spinal localization

EXAM:
LUMBAR SPINE - 2-3 VIEW

[lateral (1 of 2)]
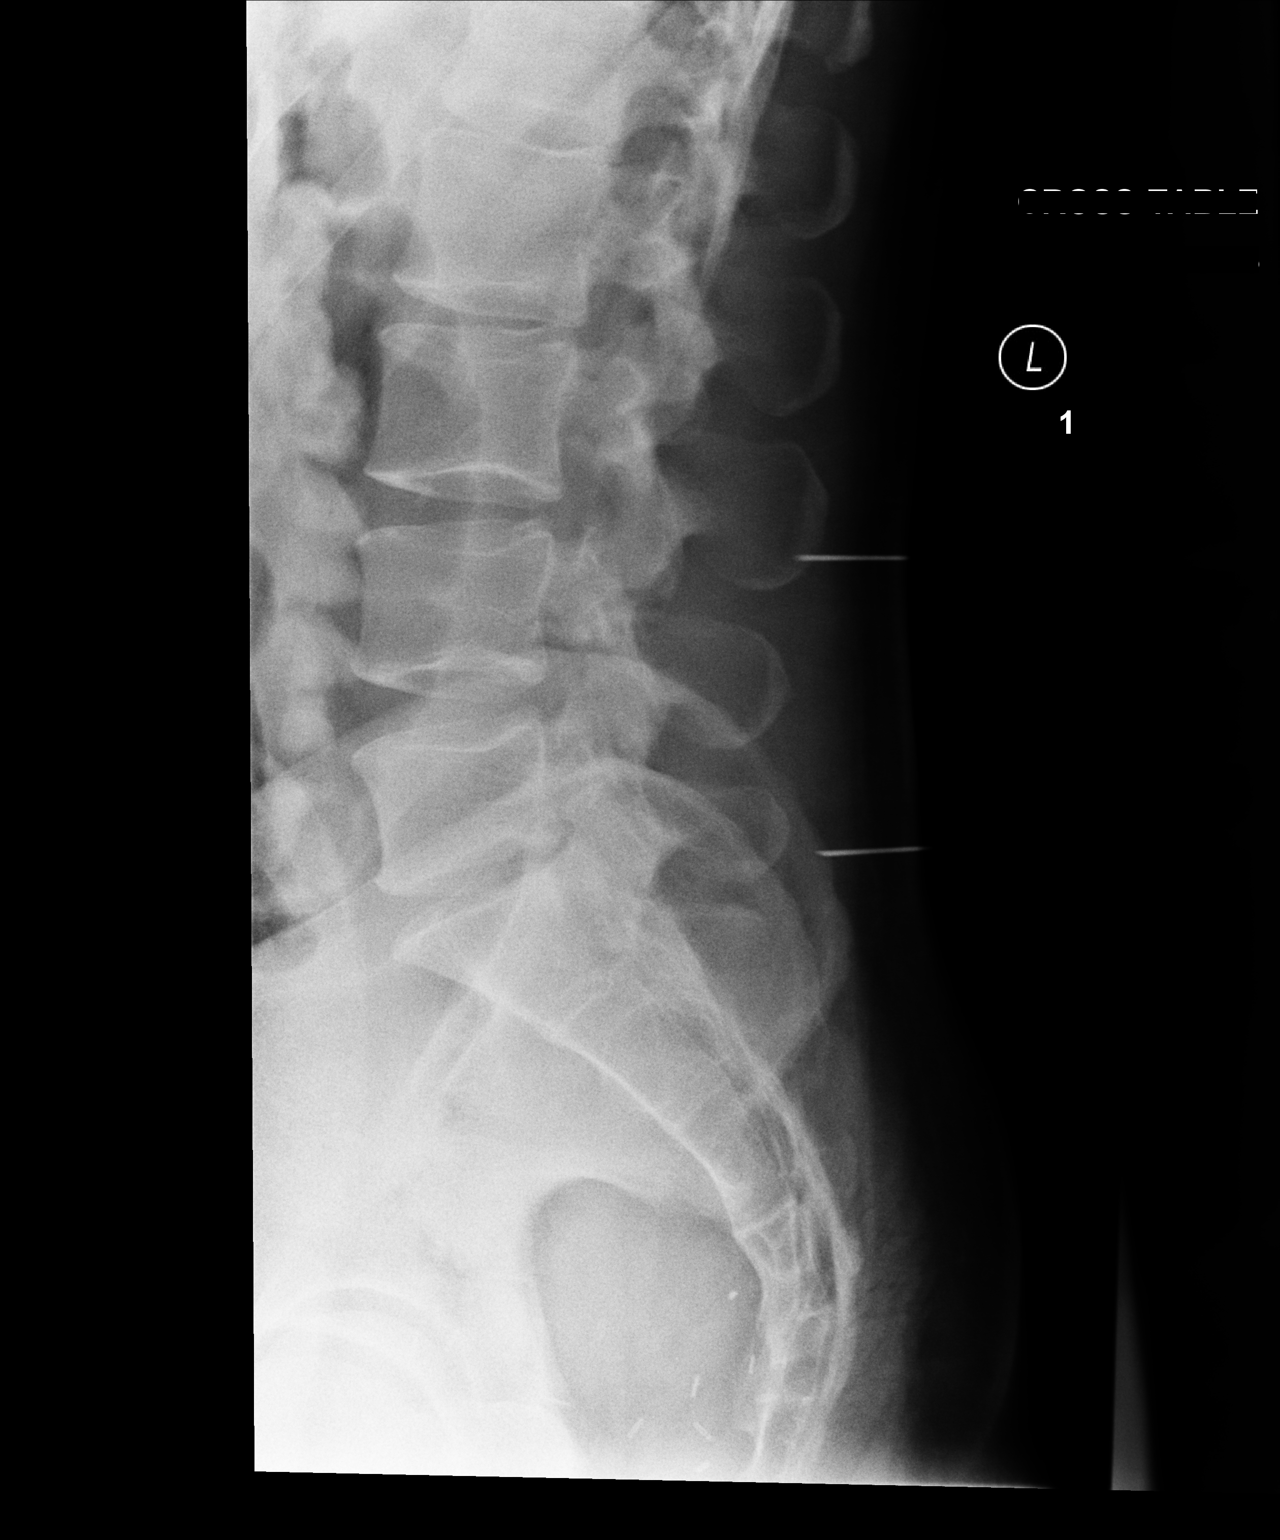

[lateral (2 of 2)]
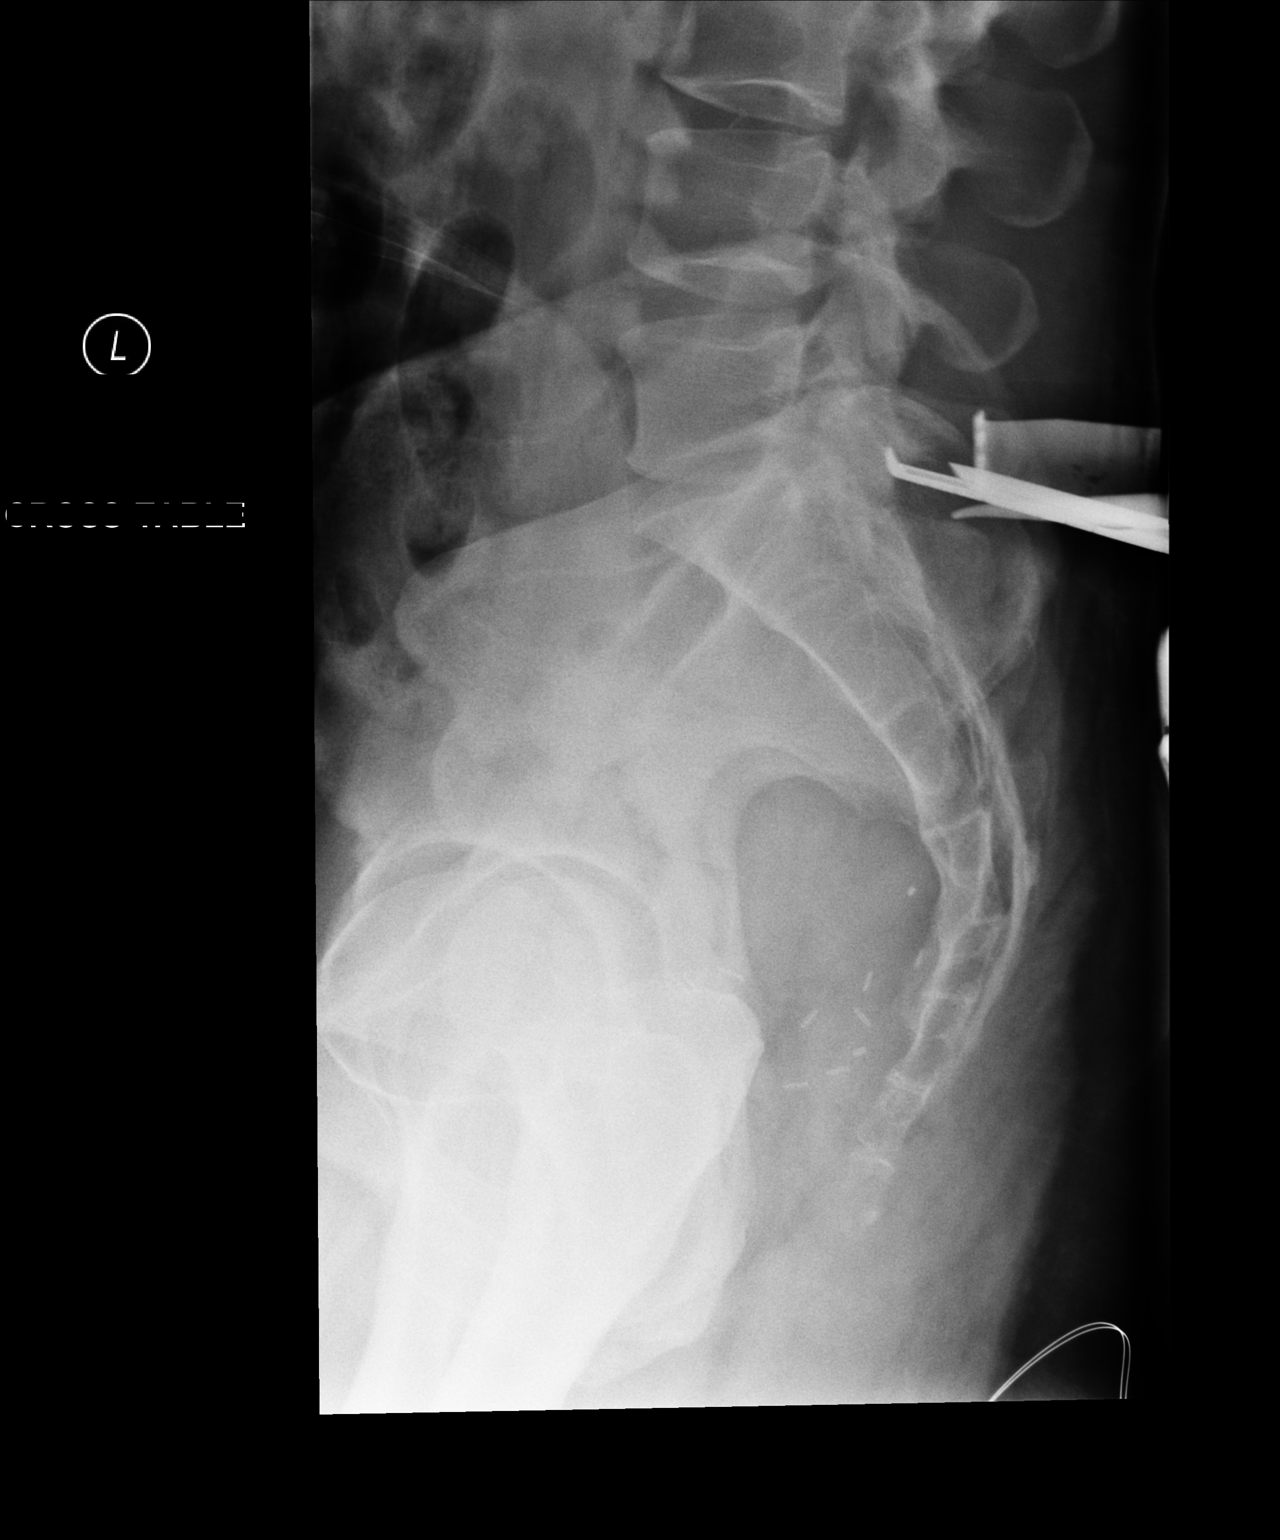

[2 of 2 positions shown; findings below may reference images not displayed]

FINDINGS: Single spot lateral projection radiographic image of the lower
lumbar spine is provided for review. Spinal labeling is based on the
presumption of 5 non rib-bearing lumbar type vertebral bodies.

Marking needles are seen posterior to the L3-L4 and L5-S1
intervertebral disc spaces.
IMPRESSION: Intraoperative localization of L3-L4 and L5-S1 as above.

## 2021-08-02 ENCOUNTER — Ambulatory Visit: Payer: Managed Care, Other (non HMO) | Admitting: Registered Nurse

## 2021-08-26 ENCOUNTER — Other Ambulatory Visit: Payer: Self-pay

## 2021-08-26 ENCOUNTER — Ambulatory Visit (INDEPENDENT_AMBULATORY_CARE_PROVIDER_SITE_OTHER): Payer: Self-pay | Admitting: Registered Nurse

## 2021-08-26 ENCOUNTER — Encounter: Payer: Self-pay | Admitting: Registered Nurse

## 2021-08-26 VITALS — BP 128/81 | HR 97 | Temp 98.3°F | Resp 18 | Ht 71.0 in | Wt 211.8 lb

## 2021-08-26 DIAGNOSIS — L989 Disorder of the skin and subcutaneous tissue, unspecified: Secondary | ICD-10-CM

## 2021-08-26 DIAGNOSIS — K219 Gastro-esophageal reflux disease without esophagitis: Secondary | ICD-10-CM

## 2021-08-26 DIAGNOSIS — Z130 Encounter for screening for diseases of the blood and blood-forming organs and certain disorders involving the immune mechanism: Secondary | ICD-10-CM

## 2021-08-26 DIAGNOSIS — Z1329 Encounter for screening for other suspected endocrine disorder: Secondary | ICD-10-CM

## 2021-08-26 DIAGNOSIS — Z13220 Encounter for screening for lipoid disorders: Secondary | ICD-10-CM

## 2021-08-26 DIAGNOSIS — Z13228 Encounter for screening for other metabolic disorders: Secondary | ICD-10-CM

## 2021-08-26 DIAGNOSIS — Z1159 Encounter for screening for other viral diseases: Secondary | ICD-10-CM

## 2021-08-26 DIAGNOSIS — Z0000 Encounter for general adult medical examination without abnormal findings: Secondary | ICD-10-CM

## 2021-08-26 LAB — CBC WITH DIFFERENTIAL/PLATELET
Basophils Absolute: 0 10*3/uL (ref 0.0–0.1)
Basophils Relative: 0.6 % (ref 0.0–3.0)
Eosinophils Absolute: 0.2 10*3/uL (ref 0.0–0.7)
Eosinophils Relative: 3 % (ref 0.0–5.0)
HCT: 48.1 % (ref 39.0–52.0)
Hemoglobin: 16.4 g/dL (ref 13.0–17.0)
Lymphocytes Relative: 24 % (ref 12.0–46.0)
Lymphs Abs: 1.7 10*3/uL (ref 0.7–4.0)
MCHC: 34 g/dL (ref 30.0–36.0)
MCV: 87.9 fl (ref 78.0–100.0)
Monocytes Absolute: 0.5 10*3/uL (ref 0.1–1.0)
Monocytes Relative: 6.6 % (ref 3.0–12.0)
Neutro Abs: 4.6 10*3/uL (ref 1.4–7.7)
Neutrophils Relative %: 65.8 % (ref 43.0–77.0)
Platelets: 224 10*3/uL (ref 150.0–400.0)
RBC: 5.47 Mil/uL (ref 4.22–5.81)
RDW: 13.5 % (ref 11.5–15.5)
WBC: 7.1 10*3/uL (ref 4.0–10.5)

## 2021-08-26 LAB — HEMOGLOBIN A1C: Hgb A1c MFr Bld: 5.8 % (ref 4.6–6.5)

## 2021-08-26 MED ORDER — PANTOPRAZOLE SODIUM 40 MG PO TBEC: 40.0000 mg | DELAYED_RELEASE_TABLET | Freq: Every day | ORAL | 3 refills | Status: AC

## 2021-08-26 NOTE — Progress Notes (Signed)
New Patient Office Visit  Subjective:  Patient ID: Steven Evans, male    DOB: 1986-07-08  Age: 35 y.o. MRN: [id number]  CC:  Chief Complaint  Patient presents with   New Patient (Initial Visit)    Patient states he is here to establish care. Also would like to discuss acid reflux he has been having     HPI Steven Evans presents for est care and CPE  GERD Ongoing Notes he dips, drinks. Likely contributes. No dietary associations - happens regardless No melena, nausea, or vomiting Takes Zantac OTC  Excessive belching  Otherwise no concerns.   Past Medical History:  Diagnosis Date   Hirschsprung's disease     Past Surgical History:  Procedure Laterality Date   LUMBAR LAMINECTOMY/DECOMPRESSION MICRODISCECTOMY Right 03/23/2019   Procedure: RIGHT SIDED-LUMBAR 5 - SACRUM 1 MICRODISECTOMY;  Surgeon: Estill Bamberg, MD;  Location: [facility] OR;  Service: Orthopedics;  Laterality: Right;   OTHER SURGICAL HISTORY     hirschsprung's disease repair as a baby    Family History  Problem Relation Age of Onset   Diabetes Father     Social History   Socioeconomic History   Marital status: Single    Spouse name: Not on file   Number of children: 0   Years of education: Not on file   Highest education level: Not on file  Occupational History    Employer: [employer] farm manager   Tobacco Use   Smoking status: Never   Smokeless tobacco: Current    Types: Chew  Vaping Use   Vaping Use: Never used  Substance and Sexual Activity   Alcohol use: Not Currently    Comment: socially    Drug use: Never   Sexual activity: Not on file  Other Topics Concern   Not on file  Social History Narrative   Not on file   Social Determinants of Health   Financial Resource Strain: Not on file  Food Insecurity: Not on file  Transportation Needs: Not on file  Physical Activity: Not on file  Stress: Not on file  Social Connections: Not on file  Intimate Partner Violence: Not on file     ROS Review of Systems  Constitutional: Negative.   HENT: Negative.    Eyes: Negative.   Respiratory: Negative.    Cardiovascular: Negative.   Gastrointestinal:  Positive for abdominal pain.  Genitourinary: Negative.   Musculoskeletal: Negative.   Skin: Negative.   Neurological: Negative.   Psychiatric/Behavioral: Negative.    All other systems reviewed and are negative.  Objective:   Today's Vitals: BP 128/81   Pulse 97   Temp 98.3 F (36.8 C) (Temporal)   Resp 18   Ht 5\' 11"  (1.803 m)   Wt 211 lb 12.8 oz (96.1 kg)   SpO2 99%   BMI 29.54 kg/m   Physical Exam Vitals and nursing note reviewed.  Constitutional:      General: He is not in acute distress.    Appearance: Normal appearance. He is obese. He is not ill-appearing, toxic-appearing or diaphoretic.  HENT:     Head: Normocephalic and atraumatic.     Right Ear: Tympanic membrane, ear canal and external ear normal. There is no impacted cerumen.     Left Ear: Tympanic membrane, ear canal and external ear normal. There is no impacted cerumen.     Nose: Nose normal. No congestion or rhinorrhea.     Mouth/Throat:     Mouth: Mucous membranes are moist.  Pharynx: Oropharynx is clear. No oropharyngeal exudate or posterior oropharyngeal erythema.  Eyes:     General: No scleral icterus.       Right eye: No discharge.        Left eye: No discharge.     Extraocular Movements: Extraocular movements intact.     Conjunctiva/sclera: Conjunctivae normal.     Pupils: Pupils are equal, round, and reactive to light.  Neck:     Vascular: No carotid bruit.  Cardiovascular:     Rate and Rhythm: Normal rate and regular rhythm.     Pulses: Normal pulses.     Heart sounds: Normal heart sounds. No murmur heard.   No friction rub. No gallop.  Pulmonary:     Effort: Pulmonary effort is normal. No respiratory distress.     Breath sounds: Normal breath sounds. No stridor. No wheezing, rhonchi or rales.  Chest:     Chest wall:  No tenderness.  Abdominal:     General: Abdomen is flat. Bowel sounds are normal. There is no distension.     Palpations: Abdomen is soft. There is no mass.     Tenderness: There is no abdominal tenderness. There is no right CVA tenderness, left CVA tenderness, guarding or rebound.     Hernia: No hernia is present.  Musculoskeletal:        General: No swelling, tenderness, deformity or signs of injury. Normal range of motion.     Cervical back: Normal range of motion and neck supple. No rigidity or tenderness.     Right lower leg: No edema.     Left lower leg: No edema.  Lymphadenopathy:     Cervical: No cervical adenopathy.  Skin:    General: Skin is warm and dry.     Capillary Refill: Capillary refill takes less than 2 seconds.     Coloration: Skin is not jaundiced or pale.     Findings: Lesion (interior R ankle. red. flat. blanches. not painful. benign in apperance.) present. No bruising, erythema or rash.  Neurological:     General: No focal deficit present.     Mental Status: He is alert and oriented to person, place, and time. Mental status is at baseline.     Cranial Nerves: No cranial nerve deficit.     Sensory: No sensory deficit.     Motor: No weakness.     Coordination: Coordination normal.     Gait: Gait normal.     Deep Tendon Reflexes: Reflexes normal.  Psychiatric:        Mood and Affect: Mood normal.        Behavior: Behavior normal.        Thought Content: Thought content normal.        Judgment: Judgment normal.    Assessment & Plan:   Problem List Items Addressed This Visit       Digestive   Gastroesophageal reflux disease   Relevant Medications   pantoprazole (PROTONIX) 40 MG tablet   Other Relevant Orders   H. pylori antibody, IgG   Other Visit Diagnoses     Annual physical exam    -  Primary   Screening for endocrine, metabolic and immunity disorder       Relevant Orders   Comprehensive metabolic panel   CBC with Differential/Platelet   TSH    Hemoglobin A1c   Lipid screening       Relevant Orders   Lipid panel   Encounter for screening for other viral diseases  Relevant Orders   Hepatitis C antibody   HIV antibody (with reflex)   Skin lesion of foot           Outpatient Encounter Medications as of 08/26/2021  Medication Sig   Multiple Vitamins-Minerals (MULTIVITAMIN WITH MINERALS) tablet Take 1 tablet by mouth daily.   pantoprazole (PROTONIX) 40 MG tablet Take 1 tablet (40 mg total) by mouth daily.   No facility-administered encounter medications on file as of 08/26/2021.    Follow-up: Return in about 1 year (around 08/26/2022) for CPE and labs.   PLAN Exam unremarkable beyond benign appearing red lesion on ankle.  Labs collected. Will follow up with the patient as warranted. Pantoprazole 40mg  po qd for GERD. Suggest 2 week course then break. Reviewed nonpharm including dietary choices to prevent GERD. Patient encouraged to call clinic with any questions, comments, or concerns.  , NP

## 2021-08-26 NOTE — Addendum Note (Signed)
Addended by: Janeece Agee on: 08/26/2021 01:12 PM   Modules accepted: Orders

## 2021-08-26 NOTE — Patient Instructions (Addendum)
Steven Evans -  Randie Heinz to meet you  No urgent concerns on exam  Let's see what labs show - should be back tomorrow.  Try pantoprazole once daily for reflux. Take with water. Refer to attached handouts for more info.  See you in a year for a physical, sooner if you need anything  Thanks,  Steven Evans     If you have lab work done today you will be contacted with your lab results within the next 2 weeks.  If you have not heard from [place] then please contact us. The fastest way to get your results is to register for My Chart.   IF you received an x-ray today, you will receive an invoice from [facility]. Please contact [facility] at [phone] with questions or concerns regarding your invoice.   IF you received labwork today, you will receive an invoice from [employer]. Please contact [facility] at [phone] with questions or concerns regarding your invoice.   Our billing staff will not be able to assist you with questions regarding bills from these companies.  You will be contacted with the lab results as soon as they are available. The fastest way to get your results is to activate your My Chart account. Instructions are located on the last page of this paperwork. If you have not heard from [place] regarding the results in 2 weeks, please contact this office.

## 2021-08-27 ENCOUNTER — Other Ambulatory Visit: Payer: Self-pay | Admitting: Registered Nurse

## 2021-08-27 DIAGNOSIS — R7989 Other specified abnormal findings of blood chemistry: Secondary | ICD-10-CM

## 2021-08-27 DIAGNOSIS — E781 Pure hyperglyceridemia: Secondary | ICD-10-CM

## 2021-08-27 LAB — HIV ANTIBODY (ROUTINE TESTING W REFLEX): HIV 1&2 Ab, 4th Generation: NONREACTIVE

## 2021-08-27 LAB — COMPREHENSIVE METABOLIC PANEL
ALT: 79 U/L — ABNORMAL HIGH (ref 0–53)
AST: 27 U/L (ref 0–37)
Albumin: 4.5 g/dL (ref 3.5–5.2)
Alkaline Phosphatase: 66 U/L (ref 39–117)
BUN: 16 mg/dL (ref 6–23)
CO2: 27 mEq/L (ref 19–32)
Calcium: 10.3 mg/dL (ref 8.4–10.5)
Chloride: 99 mEq/L (ref 96–112)
Creatinine, Ser: 1.16 mg/dL (ref 0.40–1.50)
GFR: 82.14 mL/min (ref >60.00)
Glucose, Bld: 105 mg/dL — ABNORMAL HIGH (ref 70–99)
Potassium: 4.2 mEq/L (ref 3.5–5.1)
Sodium: 137 mEq/L (ref 135–145)
Total Bilirubin: 0.4 mg/dL (ref 0.2–1.2)
Total Protein: 7.5 g/dL (ref 6.0–8.3)

## 2021-08-27 LAB — HEPATITIS C ANTIBODY
Hepatitis C Ab: NONREACTIVE
SIGNAL TO CUT-OFF: 0.05 (ref <1.00)

## 2021-08-27 LAB — LIPID PANEL
Cholesterol: 285 mg/dL — ABNORMAL HIGH (ref 0–200)
HDL: 66.7 mg/dL (ref >39.00)
Total CHOL/HDL Ratio: 4
Triglycerides: 507 mg/dL — ABNORMAL HIGH (ref 0.0–149.0)

## 2021-08-27 LAB — TSH: TSH: 1.72 u[IU]/mL (ref 0.35–5.50)

## 2021-08-27 LAB — LDL CHOLESTEROL, DIRECT: Direct LDL: 162 mg/dL

## 2021-08-28 LAB — H PYLORI, IGM, IGG, IGA AB
H pylori, IgM Abs: <9 units (ref 0.0–8.9)
H. pylori, IgA Abs: <9 units (ref 0.0–8.9)
H. pylori, IgG AbS: 0.18 Index Value (ref 0.00–0.79)

## 2022-08-25 ENCOUNTER — Encounter: Payer: Self-pay | Admitting: Podiatry

## 2022-08-25 ENCOUNTER — Ambulatory Visit (INDEPENDENT_AMBULATORY_CARE_PROVIDER_SITE_OTHER): Payer: 59 | Admitting: Podiatry

## 2022-08-25 DIAGNOSIS — L600 Ingrowing nail: Secondary | ICD-10-CM

## 2022-08-25 DIAGNOSIS — L03031 Cellulitis of right toe: Secondary | ICD-10-CM

## 2022-08-25 MED ORDER — NEOMYCIN-POLYMYXIN-HC 3.5-10000-1 OT SUSP: OTIC | 0 refills | Status: AC

## 2022-08-25 MED ORDER — CEPHALEXIN 500 MG PO CAPS: 500.0000 mg | ORAL_CAPSULE | Freq: Three times a day (TID) | ORAL | 0 refills | Status: AC

## 2022-08-25 NOTE — Progress Notes (Signed)
  Subjective:  Patient ID: Steven Evans, male    DOB: 1986/07/17,  MRN: [id number]  Chief Complaint  Patient presents with   Ingrown Toenail    Right great toenail, looks infected    36 y.o. male presents with the above complaint. History confirmed with patient.   Objective:  Physical Exam: warm, good capillary refill, no trophic changes or ulcerative lesions, normal DP and PT pulses, normal sensory exam, and ingrown nail hallux right medial.  Assessment:   1. Ingrown right greater toenail      Plan:  Patient was evaluated and treated and all questions answered.    Ingrown Nail, right -Patient elects to proceed with minor surgery to remove ingrown toenail today. Consent reviewed and signed by patient. -Ingrown nail excised. See procedure note. -Educated on post-procedure care including soaking. Written instructions provided and reviewed.  Procedure: Excision of Ingrown Toenail Location: Right 1st toe medial nail borders. Anesthesia: Lidocaine 1% plain; 1.5 mL and Marcaine 0.5% plain; 1.5 mL, digital block. Skin Prep: Betadine. Dressing: Silvadene; telfa; dry, sterile, compression dressing. Technique: Following skin prep, the toe was exsanguinated and a tourniquet was secured at the base of the toe. The affected nail border was freed, split with a nail splitter, and excised. Chemical matrixectomy was then performed with phenol and irrigated out with alcohol. The tourniquet was then removed and sterile dressing applied. Disposition: Patient tolerated procedure well.   Return if symptoms worsen or fail to improve.

## 2022-08-25 NOTE — Patient Instructions (Signed)

## 2022-09-01 ENCOUNTER — Encounter: Payer: Managed Care, Other (non HMO) | Admitting: Registered Nurse
